# Patient Record
Sex: Male | Born: 1975
Health system: Southern US, Community
[De-identification: ages and names within clinical notes are randomized; demographics above are authoritative.]

## PROBLEM LIST (undated history)

## (undated) DIAGNOSIS — K420 Umbilical hernia with obstruction, without gangrene: Secondary | ICD-10-CM

## (undated) DIAGNOSIS — K8010 Calculus of gallbladder with chronic cholecystitis without obstruction: Secondary | ICD-10-CM

## (undated) HISTORY — PX: TONSILLECTOMY: SUR1361

---

## 1995-04-24 HISTORY — PX: KNEE SURGERY: SHX244

## 2018-05-28 ENCOUNTER — Other Ambulatory Visit: Payer: Self-pay

## 2018-05-28 ENCOUNTER — Emergency Department (HOSPITAL_BASED_OUTPATIENT_CLINIC_OR_DEPARTMENT_OTHER)
Admission: EM | Admit: 2018-05-28 | Discharge: 2018-05-28 | Disposition: A | Discharge status: Home / Self Care | Payer: BC Managed Care – PPO | Attending: Emergency Medicine | Admitting: Emergency Medicine

## 2018-05-28 ENCOUNTER — Emergency Department (HOSPITAL_BASED_OUTPATIENT_CLINIC_OR_DEPARTMENT_OTHER): Payer: BC Managed Care – PPO

## 2018-05-28 DIAGNOSIS — K8020 Calculus of gallbladder without cholecystitis without obstruction: Secondary | ICD-10-CM | POA: Insufficient documentation

## 2018-05-28 DIAGNOSIS — R109 Unspecified abdominal pain: Secondary | ICD-10-CM | POA: Diagnosis present

## 2018-05-28 DIAGNOSIS — R1011 Right upper quadrant pain: Secondary | ICD-10-CM | POA: Diagnosis not present

## 2018-05-28 DIAGNOSIS — K8050 Calculus of bile duct without cholangitis or cholecystitis without obstruction: Secondary | ICD-10-CM

## 2018-05-28 LAB — CBC WITH DIFFERENTIAL/PLATELET
Abs Immature Granulocytes: 0.03 10*3/uL (ref 0.00–0.07)
Basophils Absolute: 0 10*3/uL (ref 0.0–0.1)
Basophils Relative: 0 %
Eosinophils Absolute: 0.1 10*3/uL (ref 0.0–0.5)
Eosinophils Relative: 1 %
HCT: 50.5 % (ref 39.0–52.0)
Hemoglobin: 16 g/dL (ref 13.0–17.0)
Immature Granulocytes: 0 %
Lymphocytes Relative: 14 %
Lymphs Abs: 1.2 10*3/uL (ref 0.7–4.0)
MCH: 28.5 pg (ref 26.0–34.0)
MCHC: 31.7 g/dL (ref 30.0–36.0)
MCV: 89.9 fL (ref 80.0–100.0)
Monocytes Absolute: 0.7 10*3/uL (ref 0.1–1.0)
Monocytes Relative: 9 %
Neutro Abs: 6.7 10*3/uL (ref 1.7–7.7)
Neutrophils Relative %: 76 %
Platelets: 231 10*3/uL (ref 150–400)
RBC: 5.62 MIL/uL (ref 4.22–5.81)
RDW: 13.4 % (ref 11.5–15.5)
Smear Review: NORMAL
WBC: 8.7 10*3/uL (ref 4.0–10.5)
nRBC: 0 % (ref 0.0–0.2)

## 2018-05-28 LAB — URINALYSIS, ROUTINE W REFLEX MICROSCOPIC
Bilirubin Urine: NEGATIVE
Glucose, UA: NEGATIVE mg/dL
Ketones, ur: NEGATIVE mg/dL
Leukocytes, UA: NEGATIVE
Nitrite: NEGATIVE
Protein, ur: NEGATIVE mg/dL
Specific Gravity, Urine: 1.025 (ref 1.005–1.030)
pH: 6 (ref 5.0–8.0)

## 2018-05-28 LAB — URINALYSIS, MICROSCOPIC (REFLEX)

## 2018-05-28 LAB — COMPREHENSIVE METABOLIC PANEL
ALT: 28 U/L (ref 0–44)
AST: 29 U/L (ref 15–41)
Albumin: 4.2 g/dL (ref 3.5–5.0)
Alkaline Phosphatase: 61 U/L (ref 38–126)
Anion gap: 4 — ABNORMAL LOW (ref 5–15)
BUN: 12 mg/dL (ref 6–20)
CO2: 28 mmol/L (ref 22–32)
Calcium: 8.9 mg/dL (ref 8.9–10.3)
Chloride: 107 mmol/L (ref 98–111)
Creatinine, Ser: 1.03 mg/dL (ref 0.61–1.24)
GFR calc non Af Amer: >60 mL/min (ref >60)
Glucose, Bld: 122 mg/dL — ABNORMAL HIGH (ref 70–99)
Potassium: 4.1 mmol/L (ref 3.5–5.1)
SODIUM: 139 mmol/L (ref 135–145)
Total Bilirubin: 0.5 mg/dL (ref 0.3–1.2)
Total Protein: 7.3 g/dL (ref 6.5–8.1)

## 2018-05-28 LAB — TROPONIN I: Troponin I: <0.03 ng/mL (ref <0.03)

## 2018-05-28 LAB — LIPASE, BLOOD: Lipase: 33 U/L (ref 11–51)

## 2018-05-28 MED ORDER — TRAMADOL HCL 50 MG PO TABS: 50.0000 mg | ORAL_TABLET | Freq: Four times a day (QID) | ORAL | 0 refills | Status: DC | PRN

## 2018-05-28 MED ORDER — SODIUM CHLORIDE 0.9 % IV BOLUS
1000.0000 mL | Freq: Once | INTRAVENOUS | Status: AC
  Administered 2018-05-28: 1000 mL via INTRAVENOUS

## 2018-05-28 MED ORDER — FAMOTIDINE 20 MG PO TABS: 20.0000 mg | ORAL_TABLET | Freq: Two times a day (BID) | ORAL | 0 refills | Status: DC | PRN

## 2018-05-28 MED ORDER — ALUM & MAG HYDROXIDE-SIMETH 200-200-20 MG/5ML PO SUSP
30.0000 mL | Freq: Once | ORAL | Status: AC
  Administered 2018-05-28: 30 mL via ORAL
  Filled 2018-05-28: qty 30

## 2018-05-28 MED ORDER — PANTOPRAZOLE SODIUM 40 MG PO TBEC
40.0000 mg | DELAYED_RELEASE_TABLET | Freq: Once | ORAL | Status: AC
  Administered 2018-05-28: 40 mg via ORAL
  Filled 2018-05-28: qty 1

## 2018-05-28 MED ORDER — LIDOCAINE VISCOUS HCL 2 % MT SOLN
15.0000 mL | Freq: Once | OROMUCOSAL | Status: AC
  Administered 2018-05-28: 15 mL via ORAL
  Filled 2018-05-28: qty 15

## 2018-05-28 MED ORDER — ESOMEPRAZOLE MAGNESIUM 40 MG PO CPDR: 40.0000 mg | DELAYED_RELEASE_CAPSULE | Freq: Every day | ORAL | 0 refills | Status: AC

## 2018-05-28 MED ORDER — ONDANSETRON HCL 4 MG/2ML IJ SOLN
4.0000 mg | Freq: Once | INTRAMUSCULAR | Status: AC
  Administered 2018-05-28: 4 mg via INTRAVENOUS
  Filled 2018-05-28: qty 2

## 2018-05-28 MED ORDER — FAMOTIDINE IN NACL 20-0.9 MG/50ML-% IV SOLN
20.0000 mg | Freq: Once | INTRAVENOUS | Status: AC
  Administered 2018-05-28: 20 mg via INTRAVENOUS
  Filled 2018-05-28: qty 50

## 2018-05-28 MED FILL — traMADol HCL 50 MG TABS: 50 | 3 days supply | Qty: 15 | Fill #0

## 2018-05-28 MED FILL — ESOMEPRAZOLE MAG DR 40 MG C: 40 | 30 days supply | Qty: 30 | Fill #0

## 2018-05-28 MED FILL — FAMOTIDINE 20 MG TABLET: 20 | 15 days supply | Qty: 30 | Fill #0

## 2018-05-28 NOTE — Discharge Instructions (Addendum)
Take nexium daily.   Take pepcid twice daily as needed.   You can also take maalox as needed for reflux   Take tylenol, motrin for pain. Take tramadol for severe pain   You have a lot of gallstones. Please call surgeon today for follow up   Eat low fat diet   Return to ER if you have worse abdominal pain, vomiting, fever.

## 2018-05-28 NOTE — ED Notes (Signed)
Patient transported to Ultrasound 
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
## 2018-05-28 NOTE — ED Triage Notes (Signed)
Epigastric pain since this morning. Has chronic GERD and reflux pain but this is to the extreme.  Pt had just eaten a Sausage biscuit.

## 2018-06-02 ENCOUNTER — Other Ambulatory Visit: Payer: Self-pay | Admitting: General Surgery

## 2018-06-16 NOTE — Pre-Procedure Instructions (Signed)
Jose Palmer  06/16/2018      [facility] - [place], [place] - [address] [place] [address] Phone: [phone] Fax: [phone]    Your procedure is scheduled on Wednesday, March 4th.  Report to [facility] Admitting Entrance "A" at 8:00 A.M.  Call this number if you have problems the morning of surgery:  [phone]   Remember:  Do not eat after midnight.  You may drink clear liquids until 7:00 A.M. (3 hours before procedure time).  Clear liquids allowed are:  Water, Juice (non-citric and without pulp), Carbonated beverages, Clear Tea, Black Coffee only and Gatorade    Take these medicines the morning of surgery with A SIP OF WATER: esomeprazole (NEXIUM)-as needed.   7 days prior to surgery STOP taking any Aspirin (unless otherwise instructed by your surgeon), Aleve, Naproxen, Ibuprofen, Motrin, Advil, Goody's, BC's, all herbal medications, fish oil, and all vitamins.     Do not wear jewelry.  Do not wear lotions, powders, or colognes, or deodorant.  Do not shave 48 hours prior to surgery.  Men may shave face.  Do not bring valuables to the hospital.  [facility] is not responsible for any belongings or valuables.  Contacts, dentures or bridgework may not be worn into surgery.  Leave your suitcase in the car.  After surgery it may be brought to your room.  For patients admitted to the hospital, discharge time will be determined by your treatment team.  Patients discharged the day of surgery will not be allowed to drive home.    Special instructions:   [facility]- Preparing For Surgery  Before surgery, you can play an important role. Because skin is not sterile, your skin needs to be as free of germs as possible. You can reduce the number of germs on your skin by washing with CHG (chlorahexidine gluconate) Soap before surgery.  CHG is an antiseptic cleaner which kills germs and bonds with  the skin to continue killing germs even after washing.    Oral Hygiene is also important to reduce your risk of infection.  Remember - BRUSH YOUR TEETH THE MORNING OF SURGERY WITH YOUR REGULAR TOOTHPASTE  Please do not use if you have an allergy to CHG or antibacterial soaps. If your skin becomes reddened/irritated stop using the CHG.  Do not shave (including legs and underarms) for at least 48 hours prior to first CHG shower. It is OK to shave your face.  Please follow these instructions carefully.   1. Shower the NIGHT BEFORE SURGERY and the MORNING OF SURGERY with CHG.   2. If you chose to wash your hair, wash your hair first as usual with your normal shampoo.  3. After you shampoo, rinse your hair and body thoroughly to remove the shampoo.  4. Use CHG as you would any other liquid soap. You can apply CHG directly to the skin and wash gently with a scrungie or a clean washcloth.   5. Apply the CHG Soap to your body ONLY FROM THE NECK DOWN.  Do not use on open wounds or open sores. Avoid contact with your eyes, ears, mouth and genitals (private parts). Wash Face and genitals (private parts)  with your normal soap.  6. Wash thoroughly, paying special attention to the area where your surgery will be performed.  7. Thoroughly rinse your body with warm water from the neck down.  8. DO NOT  shower/wash with your normal soap after using and rinsing off the CHG Soap.  9. Pat yourself dry with a CLEAN TOWEL.  10. Wear CLEAN PAJAMAS to bed the night before surgery, wear comfortable clothes the morning of surgery  11. Place CLEAN SHEETS on your bed the night of your first shower and DO NOT SLEEP WITH PETS.    Day of Surgery:  Do not apply any deodorants/lotions.  Please wear clean clothes to the hospital/surgery center.   Remember to brush your teeth WITH YOUR REGULAR TOOTHPASTE.   Please read over the following fact sheets that you were given.

## 2018-06-17 ENCOUNTER — Encounter (HOSPITAL_COMMUNITY)
Admission: RE | Admit: 2018-06-17 | Discharge: 2018-06-17 | Disposition: A | Discharge status: Home / Self Care | Payer: BC Managed Care – PPO | Source: Ambulatory Visit | Attending: General Surgery | Admitting: General Surgery

## 2018-06-17 ENCOUNTER — Encounter (HOSPITAL_COMMUNITY): Payer: Self-pay

## 2018-06-17 ENCOUNTER — Other Ambulatory Visit: Payer: Self-pay

## 2018-06-17 DIAGNOSIS — Z01812 Encounter for preprocedural laboratory examination: Secondary | ICD-10-CM | POA: Insufficient documentation

## 2018-06-17 LAB — CBC WITH DIFFERENTIAL/PLATELET
Abs Immature Granulocytes: 0.07 10*3/uL (ref 0.00–0.07)
Basophils Absolute: 0 10*3/uL (ref 0.0–0.1)
Basophils Relative: 0 %
Eosinophils Absolute: 0.1 10*3/uL (ref 0.0–0.5)
Eosinophils Relative: 1 %
HCT: 52.3 % — ABNORMAL HIGH (ref 39.0–52.0)
Hemoglobin: 16.7 g/dL (ref 13.0–17.0)
Immature Granulocytes: 1 %
Lymphocytes Relative: 23 %
Lymphs Abs: 2 10*3/uL (ref 0.7–4.0)
MCH: 28.5 pg (ref 26.0–34.0)
MCHC: 31.9 g/dL (ref 30.0–36.0)
MCV: 89.4 fL (ref 80.0–100.0)
MONO ABS: 0.7 10*3/uL (ref 0.1–1.0)
Monocytes Relative: 8 %
Neutro Abs: 5.9 10*3/uL (ref 1.7–7.7)
Neutrophils Relative %: 67 %
Platelets: 270 10*3/uL (ref 150–400)
RBC: 5.85 MIL/uL — ABNORMAL HIGH (ref 4.22–5.81)
RDW: 13.3 % (ref 11.5–15.5)
WBC: 8.7 10*3/uL (ref 4.0–10.5)
nRBC: 0 % (ref 0.0–0.2)

## 2018-06-17 LAB — COMPREHENSIVE METABOLIC PANEL
ALT: 36 U/L (ref 0–44)
ANION GAP: 11 (ref 5–15)
AST: 24 U/L (ref 15–41)
Albumin: 4.5 g/dL (ref 3.5–5.0)
Alkaline Phosphatase: 62 U/L (ref 38–126)
BUN: 6 mg/dL (ref 6–20)
CO2: 28 mmol/L (ref 22–32)
Calcium: 9.8 mg/dL (ref 8.9–10.3)
Chloride: 101 mmol/L (ref 98–111)
Creatinine, Ser: 1.16 mg/dL (ref 0.61–1.24)
GFR calc Af Amer: >60 mL/min (ref >60)
GFR calc non Af Amer: >60 mL/min (ref >60)
Glucose, Bld: 94 mg/dL (ref 70–99)
Potassium: 3.8 mmol/L (ref 3.5–5.1)
Sodium: 140 mmol/L (ref 135–145)
Total Bilirubin: 0.4 mg/dL (ref 0.3–1.2)
Total Protein: 7.6 g/dL (ref 6.5–8.1)

## 2018-06-17 NOTE — Progress Notes (Signed)
PCP - Dr. Foye Deer Cardiologist - denies  Chest x-ray - N/A EKG - 06/02/18 Stress Test - denies ECHO - denies Cardiac Cath - denies  Sleep Study - denies CPAP - denies  Blood Thinner Instructions:N/A Aspirin Instructions:N/A  Anesthesia review: No  Patient denies shortness of breath, fever, cough and chest pain at PAT appointment   Patient verbalized understanding of instructions that were given to them at the PAT appointment. Patient was also instructed that they will need to review over the PAT instructions again at home before surgery.
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
## 2018-06-24 MED ORDER — DEXTROSE 5 % IV SOLN
3.0000 g | INTRAVENOUS | Status: AC
  Administered 2018-06-25: 3 g via INTRAVENOUS
  Filled 2018-06-24: qty 3

## 2018-06-24 NOTE — Anesthesia Preprocedure Evaluation (Addendum)
Anesthesia Evaluation  Patient identified by MRN, date of birth, ID band Patient awake    Reviewed: Allergy & Precautions, NPO status , Patient's Chart, lab work & pertinent test results  Airway Mallampati: II  TM Distance: >3 FB     Dental   Pulmonary neg pulmonary ROS,    breath sounds clear to auscultation       Cardiovascular negative cardio ROS   Rhythm:Regular Rate:Normal     Neuro/Psych    GI/Hepatic negative GI ROS, Neg liver ROS,   Endo/Other  negative endocrine ROS  Renal/GU negative Renal ROS     Musculoskeletal   Abdominal   Peds  Hematology   Anesthesia Other Findings   Reproductive/Obstetrics                            Anesthesia Physical Anesthesia Plan  ASA: III  Anesthesia Plan: General   Post-op Pain Management:    Induction: Intravenous  PONV Risk Score and Plan: 2  Airway Management Planned: Oral ETT  Additional Equipment:   Intra-op Plan:   Post-operative Plan: Extubation in OR  Informed Consent: I have reviewed the patients History and Physical, chart, labs and discussed the procedure including the risks, benefits and alternatives for the proposed anesthesia with the patient or authorized representative who has indicated his/her understanding and acceptance.     Dental advisory given  Plan Discussed with: Anesthesiologist and CRNA  Anesthesia Plan Comments:        Anesthesia Quick Evaluation

## 2018-06-25 ENCOUNTER — Encounter (HOSPITAL_COMMUNITY)
Admission: RE | Disposition: A | Discharge status: Home / Self Care | Payer: Self-pay | Source: Home / Self Care | Attending: General Surgery

## 2018-06-25 ENCOUNTER — Ambulatory Visit (HOSPITAL_COMMUNITY)
Admission: RE | Admit: 2018-06-25 | Discharge: 2018-06-25 | Disposition: A | Discharge status: Home / Self Care | Payer: BC Managed Care – PPO | Attending: General Surgery | Admitting: General Surgery

## 2018-06-25 ENCOUNTER — Encounter (HOSPITAL_COMMUNITY): Payer: Self-pay

## 2018-06-25 ENCOUNTER — Ambulatory Visit (HOSPITAL_COMMUNITY): Payer: BC Managed Care – PPO | Admitting: Anesthesiology

## 2018-06-25 ENCOUNTER — Ambulatory Visit (HOSPITAL_COMMUNITY): Payer: BC Managed Care – PPO

## 2018-06-25 ENCOUNTER — Ambulatory Visit (HOSPITAL_COMMUNITY): Payer: BC Managed Care – PPO | Admitting: Physician Assistant

## 2018-06-25 ENCOUNTER — Other Ambulatory Visit: Payer: Self-pay

## 2018-06-25 DIAGNOSIS — K760 Fatty (change of) liver, not elsewhere classified: Secondary | ICD-10-CM | POA: Insufficient documentation

## 2018-06-25 DIAGNOSIS — Z8249 Family history of ischemic heart disease and other diseases of the circulatory system: Secondary | ICD-10-CM | POA: Insufficient documentation

## 2018-06-25 DIAGNOSIS — K219 Gastro-esophageal reflux disease without esophagitis: Secondary | ICD-10-CM | POA: Insufficient documentation

## 2018-06-25 DIAGNOSIS — K811 Chronic cholecystitis: Secondary | ICD-10-CM | POA: Insufficient documentation

## 2018-06-25 DIAGNOSIS — Z813 Family history of other psychoactive substance abuse and dependence: Secondary | ICD-10-CM | POA: Insufficient documentation

## 2018-06-25 DIAGNOSIS — Z419 Encounter for procedure for purposes other than remedying health state, unspecified: Secondary | ICD-10-CM

## 2018-06-25 DIAGNOSIS — K420 Umbilical hernia with obstruction, without gangrene: Secondary | ICD-10-CM | POA: Diagnosis not present

## 2018-06-25 DIAGNOSIS — K8010 Calculus of gallbladder with chronic cholecystitis without obstruction: Secondary | ICD-10-CM

## 2018-06-25 HISTORY — PX: UMBILICAL HERNIA REPAIR: SHX196

## 2018-06-25 HISTORY — DX: Calculus of gallbladder with chronic cholecystitis without obstruction: K80.10

## 2018-06-25 HISTORY — DX: Umbilical hernia with obstruction, without gangrene: K42.0

## 2018-06-25 HISTORY — PX: CHOLECYSTECTOMY: SHX55

## 2018-06-25 HISTORY — PX: INTRAOPERATIVE CHOLANGIOGRAM: SHX5230

## 2018-06-25 SURGERY — LAPAROSCOPIC CHOLECYSTECTOMY WITH INTRAOPERATIVE CHOLANGIOGRAM
Anesthesia: General | Site: Abdomen

## 2018-06-25 MED ORDER — CHLORHEXIDINE GLUCONATE CLOTH 2 % EX PADS: 6.0000 | MEDICATED_PAD | Freq: Once | CUTANEOUS | Status: DC

## 2018-06-25 MED ORDER — PHENYLEPHRINE 40 MCG/ML (10ML) SYRINGE FOR IV PUSH (FOR BLOOD PRESSURE SUPPORT)
PREFILLED_SYRINGE | INTRAVENOUS | Status: AC
  Filled 2018-06-25: qty 10

## 2018-06-25 MED ORDER — FENTANYL CITRATE (PF) 100 MCG/2ML IJ SOLN: 25.0000 ug | INTRAMUSCULAR | Status: DC | PRN

## 2018-06-25 MED ORDER — PROPOFOL 10 MG/ML IV BOLUS
INTRAVENOUS | Status: AC
  Filled 2018-06-25: qty 20

## 2018-06-25 MED ORDER — SODIUM CHLORIDE 0.9 % IV SOLN: 250.0000 mL | INTRAVENOUS | Status: DC | PRN

## 2018-06-25 MED ORDER — ONDANSETRON HCL 4 MG/2ML IJ SOLN
INTRAMUSCULAR | Status: DC | PRN
  Administered 2018-06-25: 4 mg via INTRAVENOUS

## 2018-06-25 MED ORDER — MEPERIDINE HCL 50 MG/ML IJ SOLN: 6.2500 mg | INTRAMUSCULAR | Status: DC | PRN

## 2018-06-25 MED ORDER — LACTATED RINGERS IV SOLN: INTRAVENOUS | Status: DC

## 2018-06-25 MED ORDER — ACETAMINOPHEN 500 MG PO TABS: 1000.0000 mg | ORAL_TABLET | Freq: Four times a day (QID) | ORAL | Status: DC

## 2018-06-25 MED ORDER — LIDOCAINE 2% (20 MG/ML) 5 ML SYRINGE
INTRAMUSCULAR | Status: DC | PRN
  Administered 2018-06-25: 50 mg via INTRAVENOUS

## 2018-06-25 MED ORDER — ONDANSETRON HCL 4 MG/2ML IJ SOLN
INTRAMUSCULAR | Status: AC
  Filled 2018-06-25: qty 2

## 2018-06-25 MED ORDER — SUGAMMADEX SODIUM 200 MG/2ML IV SOLN
INTRAVENOUS | Status: DC | PRN
  Administered 2018-06-25: 300 mg via INTRAVENOUS

## 2018-06-25 MED ORDER — FENTANYL CITRATE (PF) 250 MCG/5ML IJ SOLN
INTRAMUSCULAR | Status: AC
  Filled 2018-06-25: qty 5

## 2018-06-25 MED ORDER — SODIUM CHLORIDE 0.9% FLUSH: 3.0000 mL | INTRAVENOUS | Status: DC | PRN

## 2018-06-25 MED ORDER — GABAPENTIN 300 MG PO CAPS
300.0000 mg | ORAL_CAPSULE | ORAL | Status: AC
  Administered 2018-06-25: 300 mg via ORAL
  Filled 2018-06-25: qty 1

## 2018-06-25 MED ORDER — SODIUM CHLORIDE 0.9% FLUSH: 3.0000 mL | Freq: Two times a day (BID) | INTRAVENOUS | Status: DC

## 2018-06-25 MED ORDER — BUPIVACAINE-EPINEPHRINE (PF) 0.25% -1:200000 IJ SOLN
INTRAMUSCULAR | Status: AC
  Filled 2018-06-25: qty 30

## 2018-06-25 MED ORDER — IOPAMIDOL (ISOVUE-300) INJECTION 61%
INTRAVENOUS | Status: AC
  Filled 2018-06-25: qty 50

## 2018-06-25 MED ORDER — METOCLOPRAMIDE HCL 5 MG/ML IJ SOLN: 10.0000 mg | Freq: Once | INTRAMUSCULAR | Status: DC | PRN

## 2018-06-25 MED ORDER — LIDOCAINE 2% (20 MG/ML) 5 ML SYRINGE
INTRAMUSCULAR | Status: AC
  Filled 2018-06-25: qty 5

## 2018-06-25 MED ORDER — FENTANYL CITRATE (PF) 100 MCG/2ML IJ SOLN
INTRAMUSCULAR | Status: AC
  Filled 2018-06-25: qty 2

## 2018-06-25 MED ORDER — OXYCODONE HCL 5 MG PO TABS: 5.0000 mg | ORAL_TABLET | ORAL | Status: DC | PRN

## 2018-06-25 MED ORDER — PHENYLEPHRINE HCL 10 MG/ML IJ SOLN
INTRAMUSCULAR | Status: DC | PRN
  Administered 2018-06-25 (×2): 120 ug via INTRAVENOUS

## 2018-06-25 MED ORDER — MIDAZOLAM HCL 2 MG/2ML IJ SOLN
INTRAMUSCULAR | Status: AC
  Filled 2018-06-25: qty 2

## 2018-06-25 MED ORDER — ROCURONIUM BROMIDE 50 MG/5ML IV SOSY
PREFILLED_SYRINGE | INTRAVENOUS | Status: AC
  Filled 2018-06-25: qty 10

## 2018-06-25 MED ORDER — FENTANYL CITRATE (PF) 100 MCG/2ML IJ SOLN
25.0000 ug | INTRAMUSCULAR | Status: DC | PRN
  Administered 2018-06-25: 25 ug via INTRAVENOUS

## 2018-06-25 MED ORDER — MIDAZOLAM HCL 5 MG/5ML IJ SOLN
INTRAMUSCULAR | Status: DC | PRN
  Administered 2018-06-25: 2 mg via INTRAVENOUS

## 2018-06-25 MED ORDER — SODIUM CHLORIDE 0.9 % IR SOLN
Status: DC | PRN
  Administered 2018-06-25: 1000 mL

## 2018-06-25 MED ORDER — SODIUM CHLORIDE 0.9 % IV SOLN
INTRAVENOUS | Status: DC | PRN
  Administered 2018-06-25: 15 mL

## 2018-06-25 MED ORDER — ACETAMINOPHEN 325 MG PO TABS: 650.0000 mg | ORAL_TABLET | ORAL | Status: DC | PRN

## 2018-06-25 MED ORDER — DIPHENHYDRAMINE HCL 50 MG/ML IJ SOLN
INTRAMUSCULAR | Status: AC
  Filled 2018-06-25: qty 1

## 2018-06-25 MED ORDER — LACTATED RINGERS IV SOLN
INTRAVENOUS | Status: DC | PRN
  Administered 2018-06-25 (×2): via INTRAVENOUS

## 2018-06-25 MED ORDER — DEXAMETHASONE SODIUM PHOSPHATE 10 MG/ML IJ SOLN
INTRAMUSCULAR | Status: DC | PRN
  Administered 2018-06-25: 4 mg via INTRAVENOUS

## 2018-06-25 MED ORDER — ROCURONIUM BROMIDE 50 MG/5ML IV SOSY
PREFILLED_SYRINGE | INTRAVENOUS | Status: DC | PRN
  Administered 2018-06-25: 10 mg via INTRAVENOUS
  Administered 2018-06-25: 20 mg via INTRAVENOUS
  Administered 2018-06-25: 50 mg via INTRAVENOUS

## 2018-06-25 MED ORDER — FENTANYL CITRATE (PF) 100 MCG/2ML IJ SOLN
INTRAMUSCULAR | Status: DC | PRN
  Administered 2018-06-25: 50 ug via INTRAVENOUS
  Administered 2018-06-25: 150 ug via INTRAVENOUS

## 2018-06-25 MED ORDER — PROPOFOL 10 MG/ML IV BOLUS
INTRAVENOUS | Status: DC | PRN
  Administered 2018-06-25: 20 mg via INTRAVENOUS
  Administered 2018-06-25: 180 mg via INTRAVENOUS

## 2018-06-25 MED ORDER — BUPIVACAINE-EPINEPHRINE 0.5% -1:200000 IJ SOLN
INTRAMUSCULAR | Status: DC | PRN
  Administered 2018-06-25: 18 mL

## 2018-06-25 MED ORDER — ACETAMINOPHEN 650 MG RE SUPP: 650.0000 mg | RECTAL | Status: DC | PRN

## 2018-06-25 MED ORDER — ACETAMINOPHEN 500 MG PO TABS
1000.0000 mg | ORAL_TABLET | ORAL | Status: AC
  Administered 2018-06-25: 1000 mg via ORAL
  Filled 2018-06-25: qty 2

## 2018-06-25 MED ORDER — 0.9 % SODIUM CHLORIDE (POUR BTL) OPTIME
TOPICAL | Status: DC | PRN
  Administered 2018-06-25: 1000 mL

## 2018-06-25 MED ORDER — CELECOXIB 200 MG PO CAPS
200.0000 mg | ORAL_CAPSULE | ORAL | Status: AC
  Administered 2018-06-25: 200 mg via ORAL
  Filled 2018-06-25: qty 1

## 2018-06-25 SURGICAL SUPPLY — 46 items
ADH SKN CLS APL DERMABOND .7 (GAUZE/BANDAGES/DRESSINGS) ×1
APPLIER CLIP ROT 10 11.4 M/L (STAPLE) ×3
BLADE CLIPPER SURG (BLADE) IMPLANT
CANISTER SUCT 3000ML PPV (MISCELLANEOUS) ×3 IMPLANT
CHLORAPREP W/TINT 26ML (MISCELLANEOUS) ×3 IMPLANT
CLIP APPLIE ROT 10 11.4 M/L (STAPLE) ×1 IMPLANT
COVER MAYO STAND STRL (DRAPES) ×3 IMPLANT
COVER SURGICAL LIGHT HANDLE (MISCELLANEOUS) ×3 IMPLANT
COVER WAND RF STERILE (DRAPES) ×3 IMPLANT
DERMABOND ADVANCED (GAUZE/BANDAGES/DRESSINGS) ×2
DERMABOND ADVANCED .7 DNX12 (GAUZE/BANDAGES/DRESSINGS) ×1 IMPLANT
DRAPE C-ARM 42X72 X-RAY (DRAPES) ×3 IMPLANT
ELECT CAUTERY BLADE 6.4 (BLADE) ×3 IMPLANT
ELECT REM PT RETURN 9FT ADLT (ELECTROSURGICAL) ×3
ELECTRODE REM PT RTRN 9FT ADLT (ELECTROSURGICAL) ×1 IMPLANT
GLOVE EUDERMIC 7 POWDERFREE (GLOVE) ×3 IMPLANT
GOWN STRL REUS W/ TWL LRG LVL3 (GOWN DISPOSABLE) ×2 IMPLANT
GOWN STRL REUS W/ TWL XL LVL3 (GOWN DISPOSABLE) ×1 IMPLANT
GOWN STRL REUS W/TWL LRG LVL3 (GOWN DISPOSABLE) ×6
GOWN STRL REUS W/TWL XL LVL3 (GOWN DISPOSABLE) ×3
KIT BASIN OR (CUSTOM PROCEDURE TRAY) ×3 IMPLANT
KIT TURNOVER KIT B (KITS) ×3 IMPLANT
NS IRRIG 1000ML POUR BTL (IV SOLUTION) ×3 IMPLANT
PAD ARMBOARD 7.5X6 YLW CONV (MISCELLANEOUS) ×3 IMPLANT
PENCIL BUTTON HOLSTER BLD 10FT (ELECTRODE) ×3 IMPLANT
POUCH RETRIEVAL ECOSAC 10 (ENDOMECHANICALS) IMPLANT
POUCH RETRIEVAL ECOSAC 10MM (ENDOMECHANICALS)
POUCH SPECIMEN RETRIEVAL 10MM (ENDOMECHANICALS) ×3 IMPLANT
SCISSORS LAP 5X35 DISP (ENDOMECHANICALS) ×3 IMPLANT
SET CHOLANGIOGRAPH 5 50 .035 (SET/KITS/TRAYS/PACK) ×3 IMPLANT
SET IRRIG TUBING LAPAROSCOPIC (IRRIGATION / IRRIGATOR) ×3 IMPLANT
SET TUBE SMOKE EVAC HIGH FLOW (TUBING) ×3 IMPLANT
SLEEVE ENDOPATH XCEL 5M (ENDOMECHANICALS) ×3 IMPLANT
SPECIMEN JAR SMALL (MISCELLANEOUS) ×3 IMPLANT
SUT MNCRL AB 4-0 PS2 18 (SUTURE) ×3 IMPLANT
SUT NOVA NAB DX-16 0-1 5-0 T12 (SUTURE) ×6 IMPLANT
SUT VIC AB 3-0 54X BRD REEL (SUTURE) ×1 IMPLANT
SUT VIC AB 3-0 BRD 54 (SUTURE) ×3
SUT VIC AB 3-0 SH 18 (SUTURE) ×3 IMPLANT
TOWEL OR 17X24 6PK STRL BLUE (TOWEL DISPOSABLE) ×3 IMPLANT
TOWEL OR 17X26 10 PK STRL BLUE (TOWEL DISPOSABLE) ×3 IMPLANT
TRAY LAPAROSCOPIC MC (CUSTOM PROCEDURE TRAY) ×3 IMPLANT
TROCAR XCEL BLUNT TIP 100MML (ENDOMECHANICALS) ×3 IMPLANT
TROCAR XCEL NON-BLD 11X100MML (ENDOMECHANICALS) ×3 IMPLANT
TROCAR XCEL NON-BLD 5MMX100MML (ENDOMECHANICALS) ×3 IMPLANT
WATER STERILE IRR 1000ML POUR (IV SOLUTION) ×3 IMPLANT

## 2018-06-25 NOTE — Transfer of Care (Signed)
Immediate Anesthesia Transfer of Care Note  Patient: Jose Palmer  Procedure(s) Performed: LAPAROSCOPIC CHOLECYSTECTOMY WITH ERAS PATHWAY (N/A Abdomen) Hernia Repair Umbilical Adult (N/A Abdomen) Intraoperative Cholangiogram (N/A Abdomen)  Patient Location: PACU  Anesthesia Type:General  Level of Consciousness: awake and alert   Airway & Oxygen Therapy: Patient Spontanous Breathing and Patient connected to nasal cannula oxygen  Post-op Assessment: Report given to RN and Post -op Vital signs reviewed and stable  Post vital signs: Reviewed and stable  Last Vitals:  Vitals Value Taken Time  BP 153/88 06/25/2018 10:22 AM  Temp    Pulse 94 06/25/2018 10:24 AM  Resp 15 06/25/2018 10:27 AM  SpO2 99 % 06/25/2018 10:24 AM  Vitals shown include unvalidated device data.  Last Pain:  Vitals:   06/25/18 0656  TempSrc: Oral  PainSc: 0-No pain         Complications: No apparent anesthesia complications
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
## 2018-06-25 NOTE — Interval H&P Note (Signed)
History and Physical Interval Note:  06/25/2018 7:34 AM  Jose Palmer  has presented today for surgery, with the diagnosis of gallstones  The various methods of treatment have been discussed with the patient and family. After consideration of risks, benefits and other options for treatment, the patient has consented to  Procedure(s): LAPAROSCOPIC CHOLECYSTECTOMY WITH INTRAOPERATIVE CHOLANGIOGRAM ERAS PATHWAY (N/A) as a surgical intervention .  The patient's history has been reviewed, patient examined, no change in status, stable for surgery.  I have reviewed the patient's chart and labs.  Questions were answered to the patient's satisfaction.     [name]

## 2018-06-25 NOTE — Discharge Instructions (Signed)
[facility] ______[facility] °LAPAROSCOPIC SURGERY: POST OP INSTRUCTIONS °Always review your discharge instruction sheet given to you by the facility where your surgery was performed. °IF YOU HAVE DISABILITY OR FAMILY LEAVE FORMS, YOU MUST BRING THEM TO THE OFFICE FOR PROCESSING.   °DO NOT GIVE THEM TO YOUR DOCTOR. ° °1. A prescription for pain medication may be given to you upon discharge.  Take your pain medication as prescribed, if needed.  If narcotic pain medicine is not needed, then you may take acetaminophen (Tylenol) or ibuprofen (Advil) as needed. °2. Take your usually prescribed medications unless otherwise directed. °3. If you need a refill on your pain medication, please contact your pharmacy.  They will contact our office to request authorization. Prescriptions will not be filled after 5pm or on week-ends. °4. You should follow a light diet the first few days after arrival home, such as soup and crackers, etc.  Be sure to include lots of fluids daily. °5. Most patients will experience some swelling and bruising in the area of the incisions.  Ice packs will help.  Swelling and bruising can take several days to resolve.  °6. It is common to experience some constipation if taking pain medication after surgery.  Increasing fluid intake and taking a stool softener (such as Colace) will usually help or prevent this problem from occurring.  A mild laxative (Milk of Magnesia or Miralax) should be taken according to package instructions if there are no bowel movements after 48 hours. °7. Unless discharge instructions indicate otherwise, you may remove your bandages 24-48 hours after surgery, and you may shower at that time.  You may have steri-strips (small skin tapes) in place directly over the incision.  These strips should be left on the skin for 7-10 days.  If your surgeon used skin glue on the incision, you may shower in 24 hours.  The glue will flake off over the next 2-3 weeks.  Any sutures or  staples will be removed at the office during your follow-up visit. °8. ACTIVITIES:  You may resume regular (light) daily activities beginning the next day--such as daily self-care, walking, climbing stairs--gradually increasing activities as tolerated.  You may have sexual intercourse when it is comfortable.  Refrain from any heavy lifting or straining until approved by your doctor. °a. You may drive when you are no longer taking prescription pain medication, you can comfortably wear a seatbelt, and you can safely maneuver your car and apply brakes. °b. RETURN TO WORK:  __________________________________________________________ °9. You should see your doctor in the office for a follow-up appointment approximately 2-3 weeks after your surgery.  Make sure that you call for this appointment within a day or two after you arrive home to insure a convenient appointment time. °10. OTHER INSTRUCTIONS: __________________________________________________________________________________________________________________________ __________________________________________________________________________________________________________________________ °WHEN TO CALL YOUR DOCTOR: °1. Fever over 101.0 °2. Inability to urinate °3. Continued bleeding from incision. °4. Increased pain, redness, or drainage from the incision. °5. Increasing abdominal pain ° °The clinic staff is available to answer your questions during regular business hours.  Please don’t hesitate to call and ask to speak to one of the nurses for clinical concerns.  If you have a medical emergency, go to the nearest emergency room or call 911.  A surgeon from [facility] is always on call at the hospital. °[address], [address], [place], [place]  [address] ? [address] [address], [place],    [address] °[phone] ? [phone] ? FAX [phone] °Web site:   [email or web address]         You have declined any prescription for  narcotic or controlled substances, and so no prescriptions were written. Follow the instructions below and your pain control should be more than adequate.          Managing Your Pain After Surgery Without Opioids    Thank you for participating in our program to help patients manage their pain after surgery without opioids. This is part of our effort to provide you with the best care possible, without exposing you or your family to the risk that opioids pose.  What pain can I expect after surgery? You can expect to have some pain after surgery. This is normal. The pain is typically worse the day after surgery, and quickly begins to get better. Many studies have found that many patients are able to manage their pain after surgery with Over-the-Counter (OTC) medications such as Tylenol and Motrin. If you have a condition that does not allow you to take Tylenol or Motrin, notify your surgical team.  How will I manage my pain? The best strategy for controlling your pain after surgery is around the clock pain control with Tylenol (acetaminophen) and Motrin (ibuprofen or Advil). Alternating these medications with each other allows you to maximize your pain control. In addition to Tylenol and Motrin, you can use heating pads or ice packs on your incisions to help reduce your pain.  How will I alternate your regular strength over-the-counter pain medication? You will take a dose of pain medication every three hours. ; Start by taking 650 mg of Tylenol (2 pills of 325 mg) ; 3 hours later take 600 mg of Motrin (3 pills of 200 mg) ; 3 hours after taking the Motrin take 650 mg of Tylenol ; 3 hours after that take 600 mg of Motrin.   - 1 -  See example - if your first dose of Tylenol is at 12:00 PM   12:00 PM Tylenol 650 mg (2 pills of 325 mg)  3:00 PM Motrin 600 mg (3 pills of 200 mg)  6:00 PM Tylenol 650 mg (2 pills of 325 mg)  9:00 PM Motrin 600 mg (3 pills of 200 mg)  Continue  alternating every 3 hours   We recommend that you follow this schedule around-the-clock for at least 3 days after surgery, or until you feel that it is no longer needed. Use the table on the last page of this handout to keep track of the medications you are taking. Important: Do not take more than 3000mg  of Tylenol or 3200mg  of Motrin in a 24-hour period. Do not take ibuprofen/Motrin if you have a history of bleeding stomach ulcers, severe kidney disease, &/or actively taking a blood thinner  What if I still have pain? If you have pain that is not controlled with the over-the-counter pain medications (Tylenol and Motrin or Advil) you might have what we call breakthrough pain. You will receive a prescription for a small amount of an opioid pain medication such as Oxycodone, Tramadol, or Tylenol with Codeine. Use these opioid pills in the first 24 hours after surgery if you have breakthrough pain. Do not take more than 1 pill every 4-6 hours.  If you still have uncontrolled pain after using all opioid pills, don't hesitate to call our staff using the number provided. We will help make sure you are managing your pain in the best way possible, and if necessary, we can provide a prescription for additional pain  medication.   Day 1    Time  Name of Medication Number of pills taken  Amount of Acetaminophen  Pain Level   Comments  AM PM       AM PM       AM PM       AM PM       AM PM       AM PM       AM PM       AM PM       Total Daily amount of Acetaminophen Do not take more than  3,000 mg per day      Day 2    Time  Name of Medication Number of pills taken  Amount of Acetaminophen  Pain Level   Comments  AM PM       AM PM       AM PM       AM PM       AM PM       AM PM       AM PM       AM PM       Total Daily amount of Acetaminophen Do not take more than  3,000 mg per day      Day 3    Time  Name of Medication Number of pills taken  Amount of Acetaminophen    Pain Level   Comments  AM PM       AM PM       AM PM       AM PM          AM PM       AM PM       AM PM       AM PM       Total Daily amount of Acetaminophen Do not take more than  3,000 mg per day      Day 4    Time  Name of Medication Number of pills taken  Amount of Acetaminophen  Pain Level   Comments  AM PM       AM PM       AM PM       AM PM       AM PM       AM PM       AM PM       AM PM       Total Daily amount of Acetaminophen Do not take more than  3,000 mg per day      Day 5    Time  Name of Medication Number of pills taken  Amount of Acetaminophen  Pain Level   Comments  AM PM       AM PM       AM PM       AM PM       AM PM       AM PM       AM PM       AM PM       Total Daily amount of Acetaminophen Do not take more than  3,000 mg per day       Day 6    Time  Name of Medication Number of pills taken  Amount of Acetaminophen  Pain Level  Comments  AM PM       AM PM       AM PM  AM PM       AM PM       AM PM       AM PM       AM PM       Total Daily amount of Acetaminophen Do not take more than  3,000 mg per day      Day 7    Time  Name of Medication Number of pills taken  Amount of Acetaminophen  Pain Level   Comments  AM PM       AM PM       AM PM       AM PM       AM PM       AM PM       AM PM       AM PM       Total Daily amount of Acetaminophen Do not take more than  3,000 mg per day        For additional information about how and where to safely dispose of unused opioid medications - [email or web address]  Disclaimer: This document contains information and/or instructional materials adapted from [facility] for the typical patient with your condition. It does not replace medical advice from your health care provider because your experience may differ from that of the typical patient. Talk to your health care provider if you have any questions about this document, your  condition or your treatment plan. Adapted from [facility]

## 2018-06-25 NOTE — Anesthesia Procedure Notes (Signed)
Procedure Name: Intubation Date/Time: 06/25/2018 8:45 AM Performed by: Inda Coke, CRNA Pre-anesthesia Checklist: Patient identified, Emergency Drugs available, Suction available and Patient being monitored Patient Re-evaluated:Patient Re-evaluated prior to induction Oxygen Delivery Method: Circle System Utilized Preoxygenation: Pre-oxygenation with 100% oxygen Induction Type: IV induction Ventilation: Mask ventilation without difficulty and Oral airway inserted - appropriate to patient size Laryngoscope Size: Mac and 4 Grade View: Grade I Tube type: Oral Tube size: 7.5 mm Number of attempts: 1 Airway Equipment and Method: Stylet and Oral airway Placement Confirmation: ETT inserted through vocal cords under direct vision,  positive ETCO2 and breath sounds checked- equal and bilateral Secured at: 22 cm Tube secured with: Tape Dental Injury: Teeth and Oropharynx as per pre-operative assessment

## 2018-06-25 NOTE — Anesthesia Postprocedure Evaluation (Signed)
Anesthesia Post Note  Patient: Jose Palmer  Procedure(s) Performed: LAPAROSCOPIC CHOLECYSTECTOMY WITH ERAS PATHWAY (N/A Abdomen) Hernia Repair Umbilical Adult (N/A Abdomen) Intraoperative Cholangiogram (N/A Abdomen)     Patient location during evaluation: PACU Anesthesia Type: General Level of consciousness: awake Pain management: pain level controlled Vital Signs Assessment: post-procedure vital signs reviewed and stable Respiratory status: spontaneous breathing Cardiovascular status: stable Postop Assessment: no apparent nausea or vomiting Anesthetic complications: no    Last Vitals:  Vitals:   06/25/18 0728 06/25/18 1022  BP: (!) 147/97 (!) 153/88  Pulse:  96  Resp:  (P) 16  Temp:  (!) (P) 36.4 C  SpO2:  99%    Last Pain:  Vitals:   06/25/18 0656  TempSrc: Oral  PainSc: 0-No pain                 Aedan Geimer

## 2018-06-26 ENCOUNTER — Encounter (HOSPITAL_COMMUNITY): Payer: Self-pay | Admitting: General Surgery

## 2019-05-14 IMAGING — US US ABDOMEN LIMITED
1 series · 14 of 25 positions shown · non-contrast
Comparison: None.

CLINICAL DATA: Right upper quadrant pain.

EXAM:
ULTRASOUND ABDOMEN LIMITED RIGHT UPPER QUADRANT

[Series 1: us abdomen limited · 0.17mm/px · 14 of 65 slices shown]
[im 1/65]
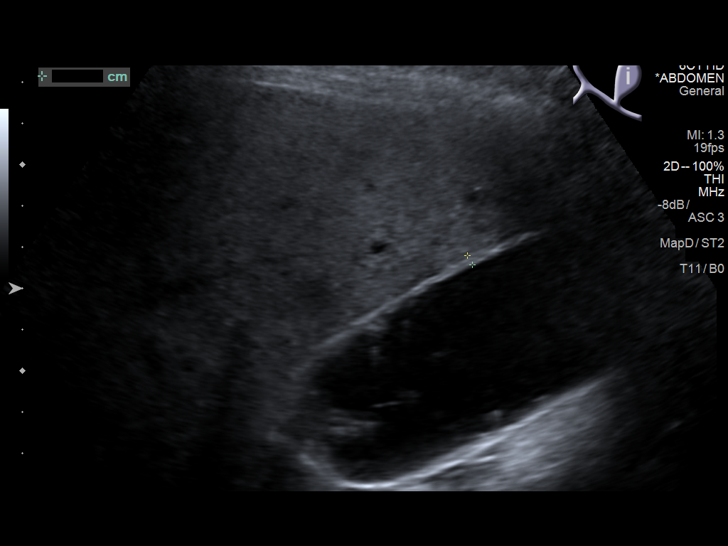
[im 6/65]
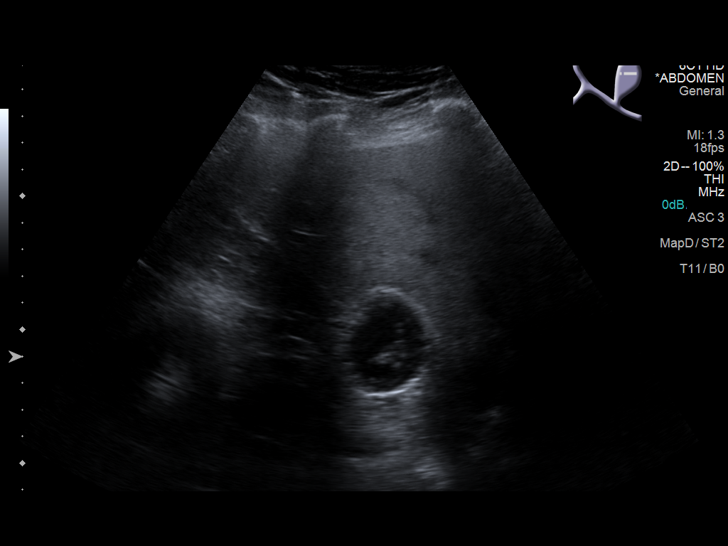
[im 11/65]
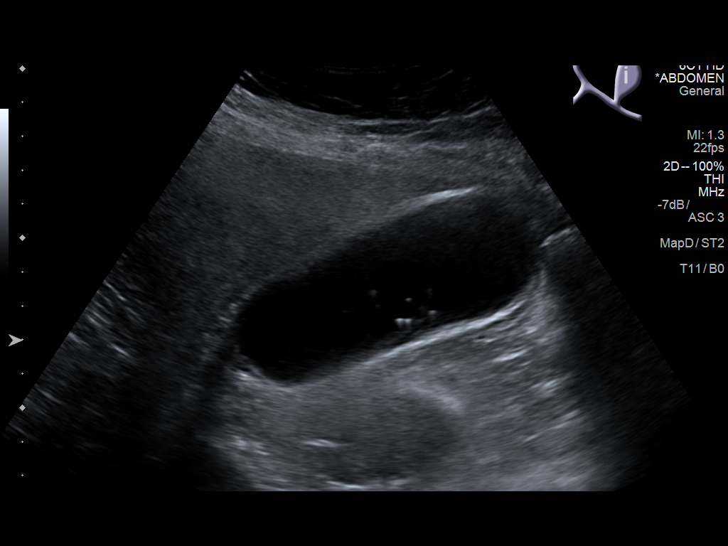
[im 17/65]
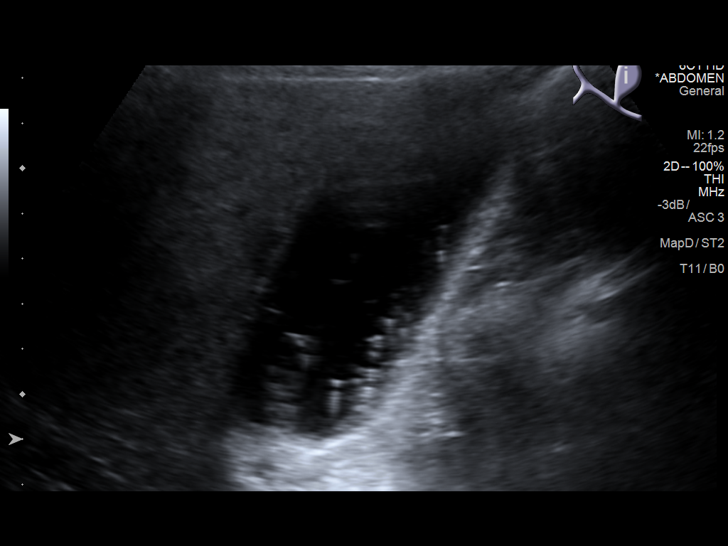
[im 22/65]
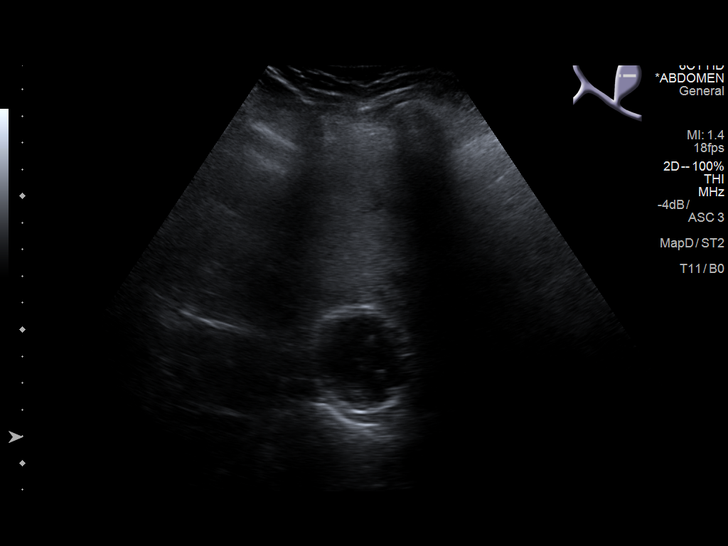
[im 25/65]
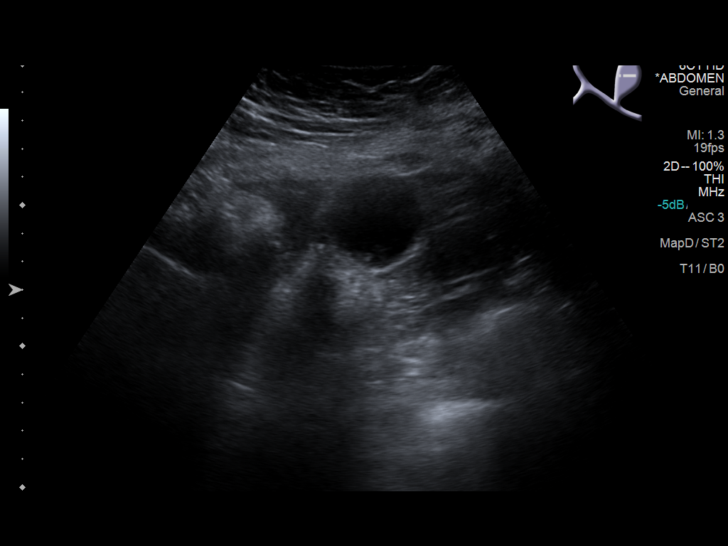
[im 30/65]
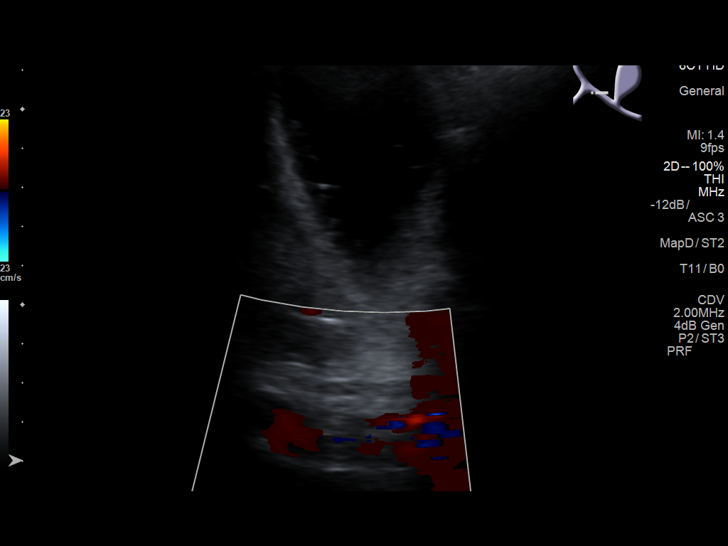
[im 35/65]
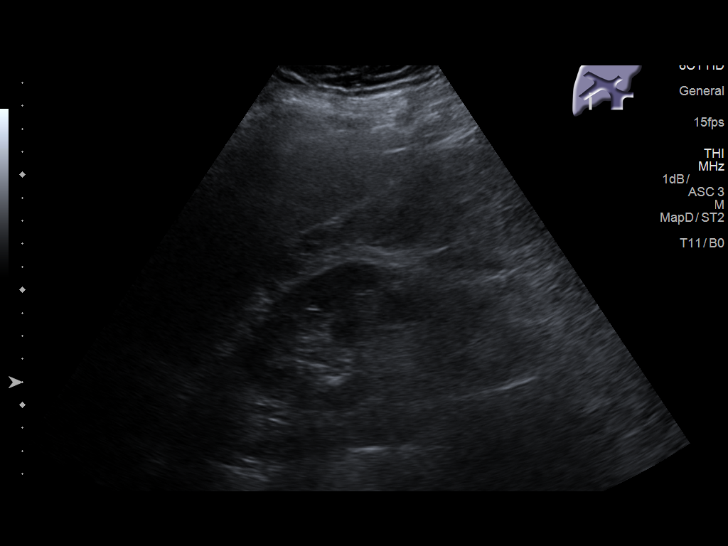
[im 41/65]
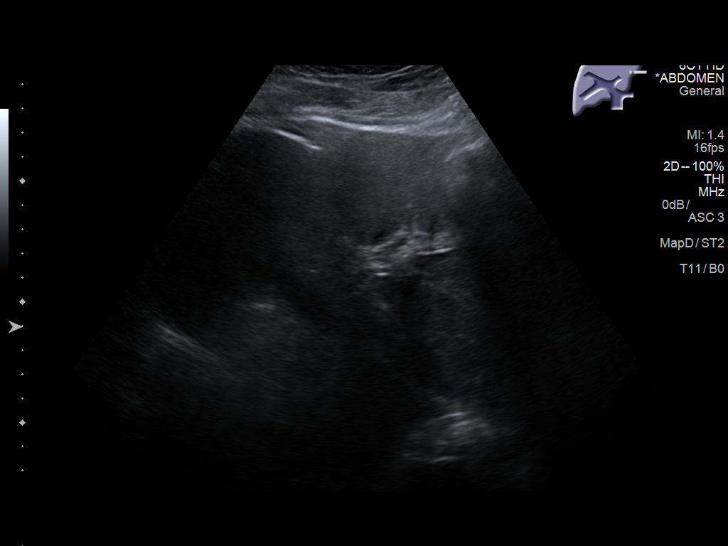
[im 43/65]
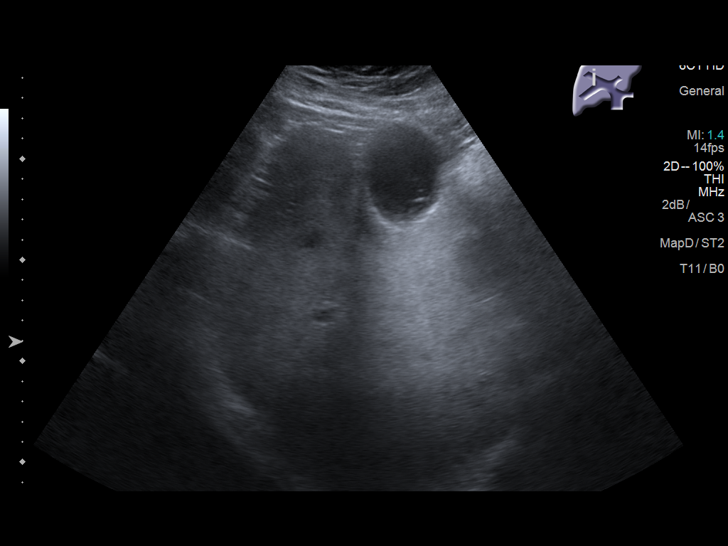
[im 49/65]
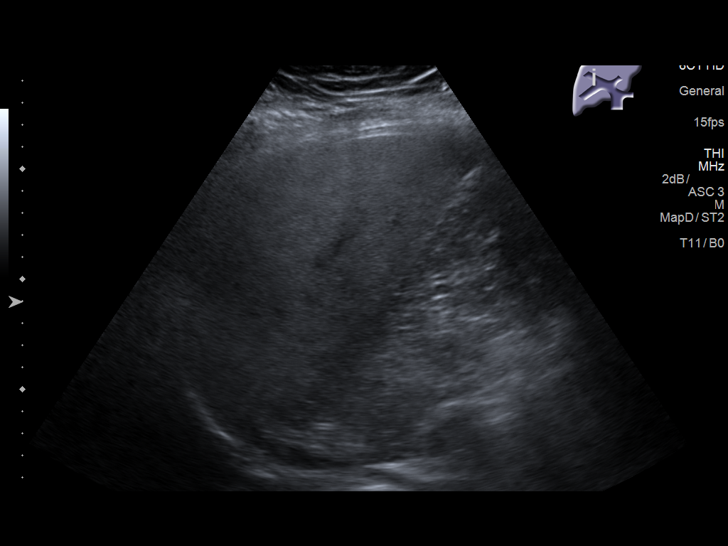
[im 54/65]
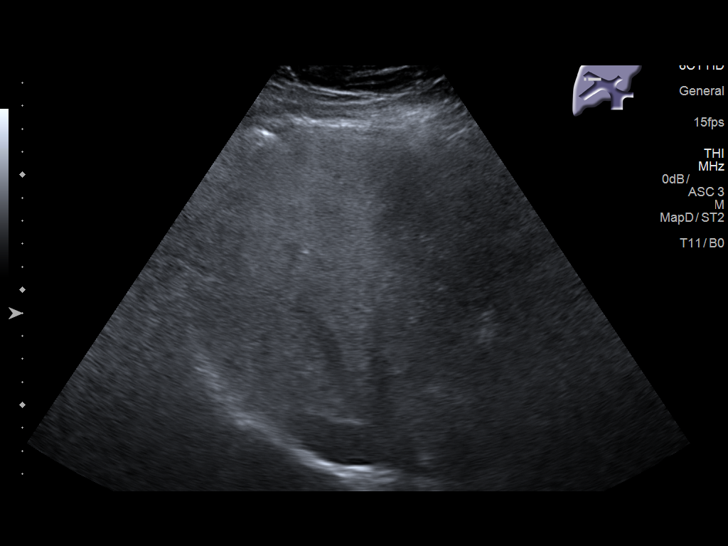
[im 59/65]
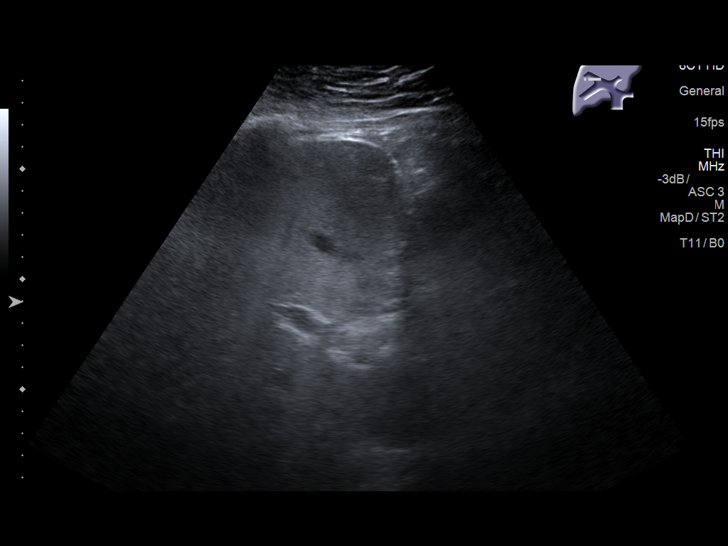
[im 65/65]
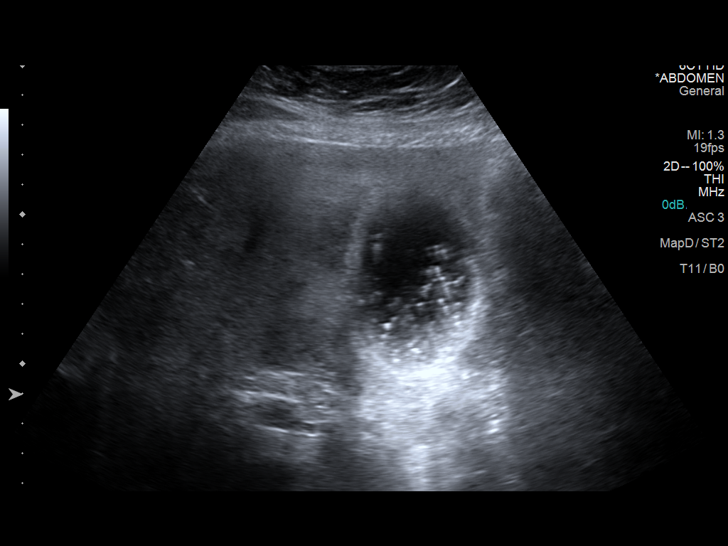

[14 of 25 positions shown; findings below may reference images not displayed]

FINDINGS: Gallbladder:

Multiple tiny stones are identified throughout the gallbladder.
There is gallbladder sludge as well. No wall thickening,
pericholecystic fluid, or Murphy's sign.

Common bile duct:

Diameter: 4.4 mm

Liver:

Increased echogenicity throughout the liver with no suspicious mass,
likely hepatic steatosis. Portal vein is patent on color Doppler
imaging with normal direction of blood flow towards the liver.
IMPRESSION: 1. Numerous small stones throughout the gallbladder. Gallbladder
sludge. No wall thickening, pericholecystic fluid, or Murphy's sign.
If there is continued concern for acute cholecystitis, recommend a
HIDA scan.
2. Probable hepatic steatosis.
